# Patient Record
Sex: Female | Born: 2000 | Race: Black or African American | Hispanic: No | Marital: Single | Smoking: Never smoker
Health system: Southern US, Community
[De-identification: ages and names within clinical notes are randomized; demographics above are authoritative.]

## PROBLEM LIST (undated history)

## (undated) DIAGNOSIS — J45909 Unspecified asthma, uncomplicated: Secondary | ICD-10-CM

## (undated) HISTORY — PX: TYMPANOSTOMY TUBE PLACEMENT: SHX32

## (undated) HISTORY — PX: HERNIA REPAIR: SHX51

---
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
## 2021-02-16 ENCOUNTER — Telehealth (INDEPENDENT_AMBULATORY_CARE_PROVIDER_SITE_OTHER): Payer: BC Managed Care – PPO | Admitting: Neurology

## 2021-02-16 ENCOUNTER — Encounter (INDEPENDENT_AMBULATORY_CARE_PROVIDER_SITE_OTHER): Payer: Self-pay | Admitting: Neurology

## 2021-02-16 ENCOUNTER — Other Ambulatory Visit: Payer: Self-pay

## 2021-02-16 VITALS — Wt 200.0 lb

## 2021-02-16 DIAGNOSIS — G43109 Migraine with aura, not intractable, without status migrainosus: Secondary | ICD-10-CM

## 2021-02-16 DIAGNOSIS — G44209 Tension-type headache, unspecified, not intractable: Secondary | ICD-10-CM | POA: Diagnosis not present

## 2021-02-16 MED ORDER — SUMATRIPTAN SUCCINATE 100 MG PO TABS: 100.0000 mg | ORAL_TABLET | Freq: Once | ORAL | 5 refills | Status: AC | PRN

## 2021-02-16 NOTE — Progress Notes (Signed)
This is a Pediatric Specialist E-Visit follow up consult provided via MyChart Brieanna Nau consented to an E-Visit consult today.  Location of patient: [name] is at Home(location) Location of provider: Keturah Shavers, MD is at Office (location) Patient was referred by Chales Salmon, MD   The following participants were involved in this E-Visit: Rodney Langton, CMA ,Keturah Shavers, MD  Chief Complain/ Reason for E-Visit today: Headaches Total time on call: 25 minutes Follow up: As Needed   Patient: Makayla [name] MRN: [id number] Sex: female DOB: 08-31-2000  Provider: Keturah Shavers, MD Location of Care: [facility] Child Neurology  Note type: Routine return visit History from: patient and [facility] chart Chief Complaint: Headache  History of Present Illness: Makayla [name] is a 21 y.o. female is here on MyChart video for follow-up visit of headaches. She has been seen over the past few years with episodes of migraine and tension type headaches for which she was on amitriptyline for a while with significant improvement of the headaches. On her last visit in August 2021 since she was doing significantly better with just occasional headaches, she was recommended to discontinue amitriptyline and just take occasional rescue medication such as ibuprofen and Imitrex for moderate to severe headache and then call my office if she develops frequent headaches otherwise continue follow-up with her primary care physician. Currently she is in college in [place] and she has been doing very well but over the past few months she has had occasional headaches more than before which is a still happening maximum 2 or 3 times a month although some of them may last more than a and there was a recent 1 that lasted for 3 days. Over the past 1 month she had 1 headache that lasted for less than a day and the second headache lasted for 3 days without any other headaches in between.  She has not had any  vomiting with the headaches although she has some sensitivity to light and occasional dizziness with a headache. She usually sleeps well without any difficulty but occasionally she has to sleep late to do her homework and also she does have some anxiety of school and her grades.  Review of Systems: 12 system review as per HPI, otherwise negative.  Past Medical History:  Diagnosis Date   Asthma    Hospitalizations: No., Head Injury: No., Nervous System Infections: No., Immunizations up to date: Yes.     Surgical History Past Surgical History:  Procedure Laterality Date   HERNIA REPAIR N/A    Phreesia 09/17/2019   TYMPANOSTOMY TUBE PLACEMENT      Family History family history includes Heart attack in her mother; Seizures in her maternal grandmother.   Social History Social History   Socioeconomic History   Marital status: Single    Spouse name: Not on file   Number of children: Not on file   Years of education: Not on file   Highest education level: Not on file  Occupational History   Not on file  Tobacco Use   Smoking status: Never   Smokeless tobacco: Never  Substance and Sexual Activity   Alcohol use: No   Drug use: No   Sexual activity: Not on file  Other Topics Concern   Not on file  Social History Narrative   Makayla [name] will be a rising freshman at [employer] she does well in school and is involved in clubs and sports. She currently lives with her mother but will be boarding in college  in the fall. She enjoys basketball, being social and reading.    Social Determinants of Health   Financial Resource Strain: Not on file  Food Insecurity: Not on file  Transportation Needs: Not on file  Physical Activity: Not on file  Stress: Not on file  Social Connections: Not on file    The medication list was reviewed and reconciled. All changes or newly prescribed medications were explained.  A complete medication list was provided to the  patient/caregiver.  No Known Allergies  Physical Exam Wt 200 lb (90.7 kg)    BMI 30.41 kg/m  Her limited neurological exam on video is unremarkable.  She was awake, alert, follows instructions appropriately with normal comprehension and fluent speech.  She had normal cranial nerves with symmetric face and no nystagmus.  She had no abnormal movements with no dysmetria on finger stress testing and no tremor.  Assessment and Plan 1. Migraine with aura and without status migrainosus, not intractable   2. Tension headache    This is a 21-year-old female with chronic migraine and tension type headaches for which she was on amitriptyline for a while but it was discontinued since she was doing better although recently she has had some more headaches in college although still she may have maximum of 2 or 3 headaches that occasionally some of them may last more than 1 day. She has no focal findings on her limited neurological exam with no other issues except for slight anxiety of school. I discussed with patient that I do not think she needs to be on preventive medication based on the frequency of the headaches but she may take Imitrex with ibuprofen in the first hour of starting headache to be more effective. Also if she increase her hydration with adequate sleep and limiting screen time, she may do better in terms of headache intensity and frequency If she develops more frequent headaches then she will call my office to start her on preventive medication at any time. She needs to continue with regular exercise on a daily basis and try to have some relaxation and prevent from anxiety issues I would not make a follow-up appointment at this time but if she develops more frequent headaches then she will call my office to schedule a follow-up visit or to start preventive medication.  She understood and agreed with the plan.  Meds ordered this encounter  Medications   SUMAtriptan (IMITREX) 100 MG tablet     Sig: Take 1 tablet (100 mg total) by mouth once as needed for up to 1 dose for migraine. Maximum 2 times a week    Dispense:  10 tablet    Refill:  5   No orders of the defined types were placed in this encounter.

## 2021-02-16 NOTE — Patient Instructions (Signed)
Since the headaches are not frequent, I would not start her on preventive medication but I told her that if she develops more than 4 or 5 headaches each month, call the office to send a preventive medication to the pharmacy I will send a prescription for Imitrex that she would take with ibuprofen for moderate to severe headache She needs to have more hydration with adequate sleep and limiting screen time She will benefit from regular exercise on a daily basis If she develops more frequent headaches, she will call my office to schedule a follow-up appointment otherwise she will continue follow-up with her primary care physician.

## 2022-03-07 ENCOUNTER — Other Ambulatory Visit (INDEPENDENT_AMBULATORY_CARE_PROVIDER_SITE_OTHER): Payer: Self-pay | Admitting: Neurology

## 2022-03-07 DIAGNOSIS — G43109 Migraine with aura, not intractable, without status migrainosus: Secondary | ICD-10-CM
